# Patient Record
Sex: Female | Born: 1995 | Hispanic: No
Health system: Southern US, Community
[De-identification: ages and names within clinical notes are randomized; demographics above are authoritative.]

## PROBLEM LIST (undated history)

## (undated) DIAGNOSIS — F419 Anxiety disorder, unspecified: Secondary | ICD-10-CM

## (undated) DIAGNOSIS — F32A Depression, unspecified: Secondary | ICD-10-CM

## (undated) DIAGNOSIS — F329 Major depressive disorder, single episode, unspecified: Secondary | ICD-10-CM

## (undated) DIAGNOSIS — J45909 Unspecified asthma, uncomplicated: Secondary | ICD-10-CM

## (undated) DIAGNOSIS — J9601 Acute respiratory failure with hypoxia: Secondary | ICD-10-CM

## (undated) DIAGNOSIS — T7840XA Allergy, unspecified, initial encounter: Secondary | ICD-10-CM

## (undated) HISTORY — DX: Depression, unspecified: F32.A

## (undated) HISTORY — DX: Unspecified asthma, uncomplicated: J45.909

## (undated) HISTORY — PX: DENTAL SURGERY: SHX609

## (undated) HISTORY — DX: Major depressive disorder, single episode, unspecified: F32.9

## (undated) HISTORY — DX: Allergy, unspecified, initial encounter: T78.40XA

## (undated) HISTORY — DX: Acute respiratory failure with hypoxia: J96.01

---
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
## 2016-02-17 ENCOUNTER — Other Ambulatory Visit: Payer: Self-pay | Admitting: Pediatrics

## 2016-02-17 ENCOUNTER — Telehealth: Payer: Self-pay | Admitting: *Deleted

## 2016-02-17 NOTE — Telephone Encounter (Signed)
Vm from pt requesting refill of Lutera. Pt has not been seen in office since 07/17/14. Per Dr. Lamar SprinklesPerry's last note, no more refills are to be given until she is seen in office for a f/u appt.    TC returned to mobile phone number in chart. LVM requesting pt call office to schedule a f/u appt.

## 2016-03-01 IMAGING — CR DG CHEST 1V PORT
1 series · 1 of 1 positions shown · non-contrast
Comparison: 07/09/2014

CLINICAL DATA: Evaluate airspace disease.

EXAM:
PORTABLE CHEST - 1 VIEW

[AP]
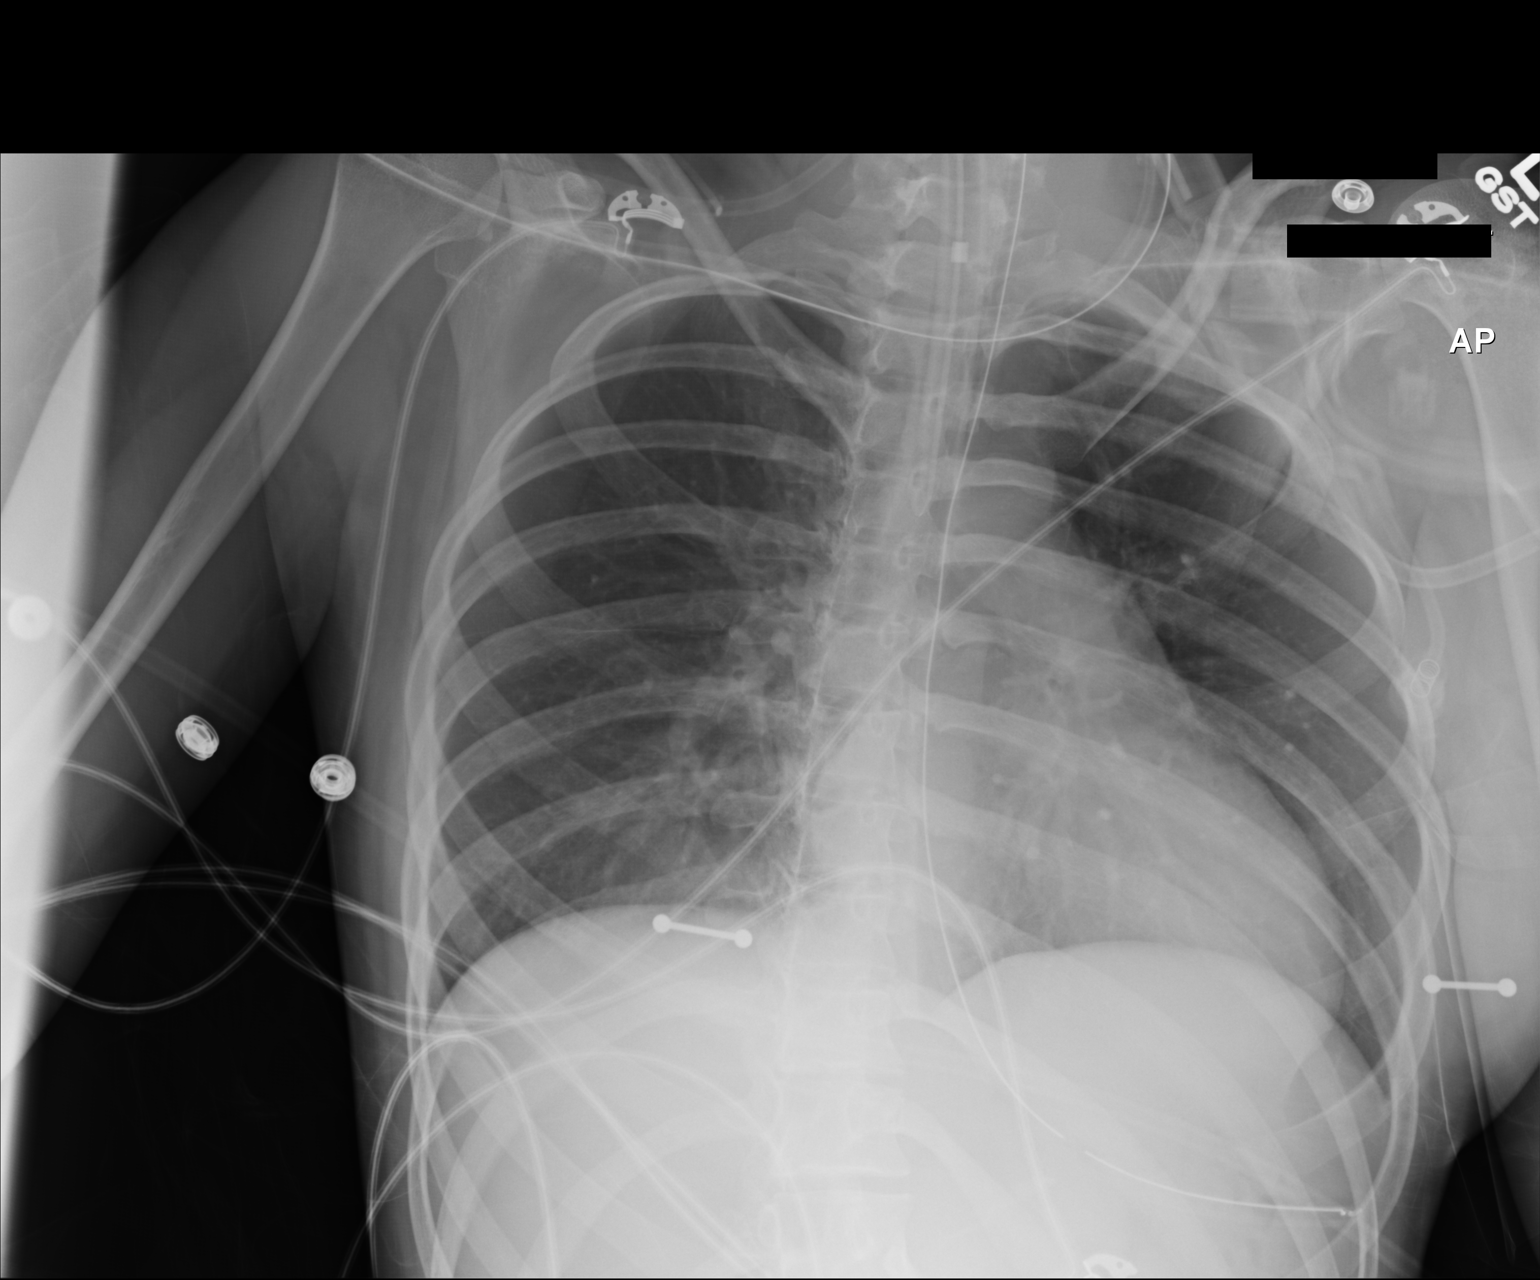

[1 of 1 positions shown; findings below may reference images not displayed]

FINDINGS: Endotracheal tube ends between the clavicular heads and carina. The
orogastric tube ends in the proximal stomach.

No cardiomegaly when considering rotation. Probable mild right
basilar atelectasis based on hazy density. No evidence of
consolidation, edema, effusion, or pneumothorax.
IMPRESSION: 1. Endotracheal and orogastric tubes remain in good position.
2. Probable mild atelectasis at the medial right base.

## 2016-03-02 IMAGING — CR DG CHEST 1V PORT
1 series · 1 of 1 positions shown · non-contrast
Comparison: 07/10/2014

CLINICAL DATA: Followup aspiration

EXAM:
PORTABLE CHEST - 1 VIEW

[AP]
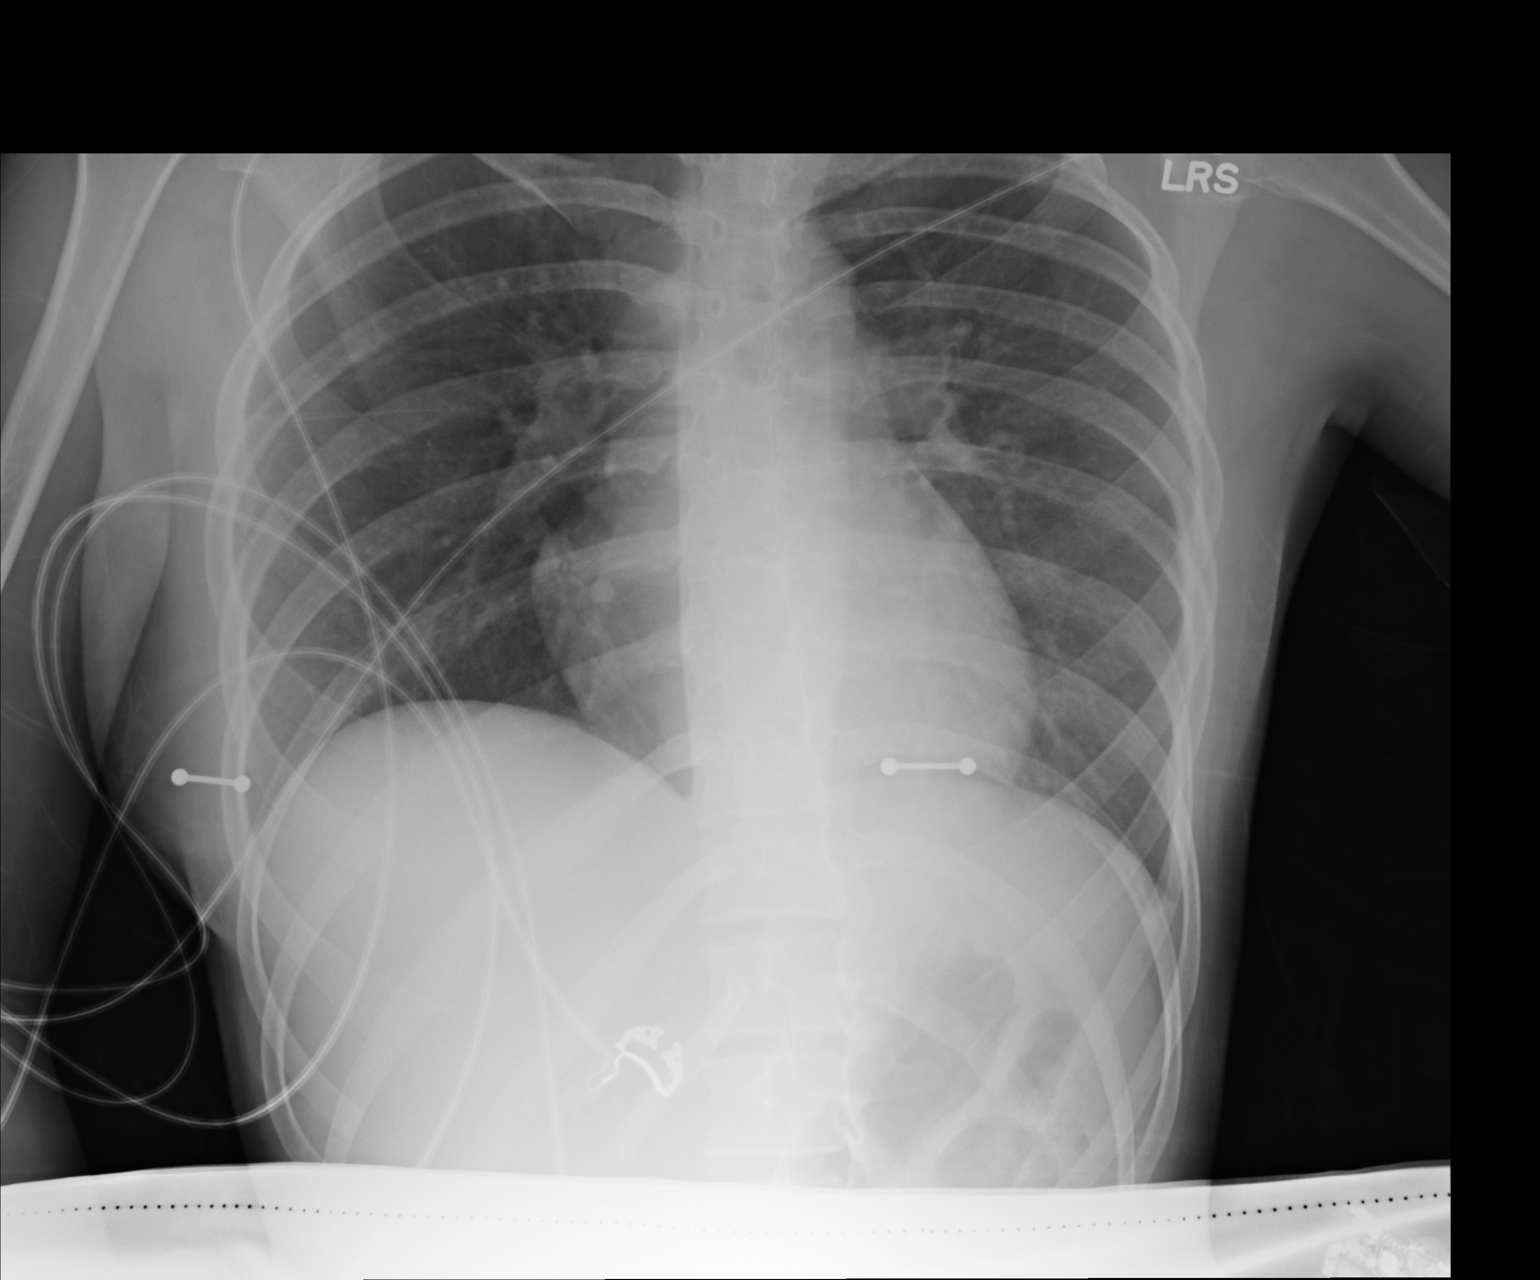

[1 of 1 positions shown; findings below may reference images not displayed]

FINDINGS: Normal heart size and upper mediastinal contours. The right lung is
normally inflated. Apparent volume loss and medial opacification on
the left is likely due to rotation. No effusion or pneumothorax. No
pulmonary edema.
IMPRESSION: Normalized right lung inflation.

## 2016-06-29 ENCOUNTER — Encounter: Payer: Self-pay | Admitting: Pediatrics

## 2016-06-30 ENCOUNTER — Encounter: Payer: Self-pay | Admitting: Pediatrics

## 2018-10-23 ENCOUNTER — Ambulatory Visit: Payer: Self-pay | Admitting: Family Medicine

## 2018-11-14 ENCOUNTER — Ambulatory Visit: Payer: Self-pay | Admitting: Family Medicine

## 2018-12-19 ENCOUNTER — Encounter: Payer: Self-pay | Admitting: Internal Medicine

## 2018-12-19 ENCOUNTER — Encounter

## 2018-12-19 ENCOUNTER — Ambulatory Visit (INDEPENDENT_AMBULATORY_CARE_PROVIDER_SITE_OTHER): Payer: BC Managed Care – PPO | Admitting: Internal Medicine

## 2018-12-19 VITALS — BP 110/80 | HR 102 | Temp 98.4°F | Ht 62.25 in | Wt 102.9 lb

## 2018-12-19 DIAGNOSIS — F39 Unspecified mood [affective] disorder: Secondary | ICD-10-CM | POA: Diagnosis not present

## 2018-12-19 DIAGNOSIS — F901 Attention-deficit hyperactivity disorder, predominantly hyperactive type: Secondary | ICD-10-CM

## 2018-12-19 DIAGNOSIS — G479 Sleep disorder, unspecified: Secondary | ICD-10-CM

## 2018-12-19 DIAGNOSIS — Z3002 Counseling and instruction in natural family planning to avoid pregnancy: Secondary | ICD-10-CM

## 2018-12-19 MED ORDER — LEVONORGESTREL-ETHINYL ESTRAD 0.1-20 MG-MCG PO TABS: 1.0000 | ORAL_TABLET | Freq: Every day | ORAL | 0 refills | Status: DC

## 2018-12-19 NOTE — Progress Notes (Signed)
New Patient Office Visit     CC/Reason for Visit: Establish care, discuss ADD, mood issues Previous PCP: Unknown   HPI: Jamie Humphrey is a 23 y.o. female who is coming in today for the above mentioned reasons.  She is due for an annual physical exam.  Had a Pap smear within the last 3 years of college.  She just finished college in May.  Her mental health issues are complex.  Her father committed suicide when patient was 23 years old.  She notes significant issues with anxiety mainly but depression at times as well.  She comes in today with her mother.  Mom states that she was diagnosed with ADD as a child was prescribed Strattera at one point she did not do well while on this and felt depressed all the time.  Throughout the year she has also been on Lexapro and other antidepressants but she did not do well on any.  She is visibly anxious, tearful today constantly moving her legs or a pencil in her hands.  She states she does this not because she is anxious but as an outlet for her hyperactivity.  She states that while at school she found ways to overcome her hyperactivity but she recognizes that she has always had issues with this.  She is active with a counselor by the name of [facility], has never seen a psychiatrist.  Has not been on medications recently.  She states she is very emotional today because her boyfriend of 18 months broke up with her yesterday.  She needs refills of her birth control medication, she would like to do her annual physicals including  Pap smears through this office.   Past Medical/Surgical History: Past Medical History:  Diagnosis Date  . Acute respiratory failure with hypoxemia (HCC) 07/10/2014  . Allergy   . Anxiety   . Asthma    exercise induced  . Depression     Past Surgical History:  Procedure Laterality Date  . DENTAL SURGERY     pulpectomy    Social History:  reports that she has been smoking cigarettes. She has never used smokeless  tobacco. She reports current alcohol use. She reports current drug use. Drug: Marijuana.  Allergies: Allergies  Allergen Reactions  . Ativan [Lorazepam] Other (See Comments)    Family history    Family History:  Family History  Problem Relation Age of Onset  . Suicidality Father      Current Outpatient Medications:  .  levonorgestrel-ethinyl estradiol (LUTERA) 0.1-20 MG-MCG tablet, Take 1 tablet by mouth daily., Disp: 1 Package, Rfl: 0  Review of Systems: Constitutional: Denies fever, chills, diaphoresis, appetite change and fatigue.  HEENT: Denies photophobia, eye pain, redness, hearing loss, ear pain, congestion, sore throat, rhinorrhea, sneezing, mouth sores, trouble swallowing, neck pain, neck stiffness and tinnitus.   Respiratory: Denies SOB, DOE, cough, chest tightness,  and wheezing.   Cardiovascular: Denies chest pain, palpitations and leg swelling.  Gastrointestinal: Denies nausea, vomiting, abdominal pain, diarrhea, constipation, blood in stool and abdominal distention.  Genitourinary: Denies dysuria, urgency, frequency, hematuria, flank pain and difficulty urinating.  Endocrine: Denies: hot or cold intolerance, sweats, changes in hair or nails, polyuria, polydipsia. Musculoskeletal: Denies myalgias, back pain, joint swelling, arthralgias and gait problem.  Skin: Denies pallor, rash and wound.  Neurological: Denies dizziness, seizures, syncope, weakness, light-headedness, numbness and headaches.  Hematological: Denies adenopathy. Easy bruising, personal or family bleeding history  Psychiatric/Behavioral: Denies suicidal ideation, confusion, positive for mood changes,  nervousness, sleep disturbance and agitation    Physical Exam: Vitals:   12/19/18 0736  BP: 110/80  Pulse: (!) 102  Temp: 98.4 F (36.9 C)  TempSrc: Oral  SpO2: 98%  Weight: 102 lb 14.4 oz (46.7 kg)  Height: 5' 2.25" (1.581 m)   Body mass index is 18.67 kg/m.  Constitutional: NAD, calm,  comfortable Eyes: PERRL, lids and conjunctivae normal ENMT: Mucous membranes are moist.  Neck: normal, supple, no masses, no thyromegaly Respiratory: clear to auscultation bilaterally, no wheezing, no crackles. Normal respiratory effort. No accessory muscle use.  Cardiovascular: Regular rate and rhythm, no murmurs / rubs / gallops. No extremity edema. 2+ pedal pulses. No carotid bruits.  Abdomen: no tenderness, no masses palpated. No hepatosplenomegaly. Bowel sounds positive.  Musculoskeletal: no clubbing / cyanosis. No joint deformity upper and lower extremities. Good ROM, no contractures. Normal muscle tone.  Skin: no rashes, lesions, ulcers. No induration Neurologic: CN 2-12 grossly intact. Sensation intact, DTR normal. Strength 5/5 in all 4.  Psychiatric: Normal judgment and insight. Alert and oriented x 3.  She is visibly anxious, unable to focus, restless.   Impression and Plan:  Mood disorder (HCC) - Plan: Ambulatory referral to Psychiatry Sleep disturbance -She seems both anxious and depressed, I wonder if she may have bipolar disorder in addition to her ADD. -Her mood/mental health issues appear complex and I believe she would be better served by seeing a psychiatrist prior to restarting any medication therapy. -She already has CBT with her longtime counselor.  I will attempt to achieve records today.  Attention deficit hyperactivity disorder (ADHD), predominantly hyperactive type -I agree with this diagnosis, given her underlying mood disorder I do not feel comfortable being the sole provider treating this. -Will send urgent referral to psychiatry, have also given her information about the [place] [facility].  Encounter for counseling and instruction in natural family planning to avoid pregnancy - Plan: levonorgestrel-ethinyl estradiol (LUTERA) 0.1-20 MG-MCG tablet -She will return within the next 3 to 4 months for her annual physical  to include Pap  smears.     Patient Instructions  -It was nice meeting you today!  -Will send out an urgent referral to psychiatry.  -Schedule follow up in 3-4 months for your annual physical.     Chaya JanEstela Hernandez Acosta, MD [facility]

## 2018-12-19 NOTE — Patient Instructions (Signed)
-  It was nice meeting you today!  -Will send out an urgent referral to psychiatry.  -Schedule follow up in 3-4 months for your annual physical.

## 2018-12-20 ENCOUNTER — Encounter (HOSPITAL_COMMUNITY): Payer: Self-pay | Admitting: Psychiatry

## 2018-12-20 ENCOUNTER — Ambulatory Visit (INDEPENDENT_AMBULATORY_CARE_PROVIDER_SITE_OTHER): Payer: BC Managed Care – PPO | Admitting: Psychiatry

## 2018-12-20 VITALS — BP 112/68 | Ht 62.0 in | Wt 104.0 lb

## 2018-12-20 DIAGNOSIS — F902 Attention-deficit hyperactivity disorder, combined type: Secondary | ICD-10-CM | POA: Diagnosis not present

## 2018-12-20 MED ORDER — LISDEXAMFETAMINE DIMESYLATE 30 MG PO CAPS: 30.0000 mg | ORAL_CAPSULE | Freq: Every day | ORAL | 0 refills | Status: DC

## 2018-12-20 NOTE — Progress Notes (Addendum)
Psychiatric Initial Adult Assessment   Patient Identification: Jamie Humphrey MRN:  [id number]  Date of Evaluation:  12/20/2018   Referral Source: Self-referred.  Chief Complaint:  I cannot focus.  I have difficulty doing multitasking.  Visit Diagnosis:    ICD-10-CM   1. Attention deficit hyperactivity disorder (ADHD), combined type F90.2 DISCONTINUED: lisdexamfetamine (VYVANSE) 30 MG capsule    History of Present Illness: Jamie Humphrey is 23 year old [place] American single unemployed female who came for her initial appointment with her mother.  Patient is struggle with lack of focus, lack of attention, difficulty multitasking, difficulty organizing her thoughts and falling behind things.  She struggle all her life but able to manage in the school.  She recently finished her college and now applying for graduate school and she is wondering if she can take something to help the symptoms.  She recall in the school she was very hyper and could not wait for her turn to talk.  However she noticed behind her schedule, work, task is always a challenge.  She told that to avoid doing multitasking and concentration she joined multiple groups and she claims herself a very social person and that helped her.  She also involved in arts and theater.  She mention her friend noticed getting easily excited, hyperverbal and cannot wait for her turn.  She admitted feeling discouraged, irritated and frustrated and feels stupid due to the symptoms.  She denies any depression, crying spells, feeling of hopelessness or worthlessness.  She denies any anger, aggressive behavior, self abusive thinking.  She denies any mania, psychosis or any hallucination.  She denies any paranoia or any aggressive behavior.  She tried Strattera in the high school for few months but that did not help her.  She also tried Lexapro when she had a break-up with her boyfriend.  She described it was a situational depression and she got better and get over  with it.  She lives with her mother and stepfather.  Her father is from [place] and her mother never married with him.  Patient told her father committed suicide because he was forced to do arrange marriage and could not cope with the marriage and finances.  She had a good social network.  She see once a week [facility] which works very good for her.  Patient endorses once in a while drink beer but no other use of illegal substance.  Associated Signs/Symptoms: Depression Symptoms:  difficulty concentrating, anxiety, (Hypo) Manic Symptoms:  Distractibility, Anxiety Symptoms:  Excessive Worry, Psychotic Symptoms:  No psychotic symptoms. PTSD Symptoms: Negative  Past Psychiatric History: History of taking Strattera to help with ADHD symptoms.  Did not see any improvement.  Took Lexapro for few months when she had situational depression after break-up with a boyfriend.  No history of psychiatric inpatient treatment or any suicidal attempt.  Previous Psychotropic Medications: Yes   Substance Abuse History in the last 12 months:  No.  Consequences of Substance Abuse: Negative  Past Medical History:  Past Medical History:  Diagnosis Date  . Acute respiratory failure with hypoxemia (HCC) 07/10/2014  . Allergy   . Anxiety   . Asthma    exercise induced  . Depression     Past Surgical History:  Procedure Laterality Date  . DENTAL SURGERY     pulpectomy    Family Psychiatric History: Father committed suicide.  Family History:  Family History  Problem Relation Age of Onset  . Suicidality Father     Social History:  Social History   Socioeconomic History  . Marital status: Single    Spouse name: Not on file  . Number of children: 0  . Years of education: Not on file  . Highest education level: Not on file  Occupational History  . Not on file  Social Needs  . Financial resource strain: Not very hard  . Food insecurity:    Worry: Never true    Inability:  Never true  . Transportation needs:    Medical: No    Non-medical: No  Tobacco Use  . Smoking status: Current Some Day Smoker    Packs/day: 0.50    Types: Cigarettes  . Smokeless tobacco: Never Used  Substance and Sexual Activity  . Alcohol use: Yes    Comment: occasionally  . Drug use: Not Currently    Types: Marijuana  . Sexual activity: Yes    Birth control/protection: Pill    Comment: unknown  Lifestyle  . Physical activity:    Days per week: 3 days    Minutes per session: 30 min  . Stress: Not on file  Relationships  . Social connections:    Talks on phone: Not on file    Gets together: Not on file    Attends religious service: Not on file    Active member of club or organization: Not on file    Attends meetings of clubs or organizations: Not on file    Relationship status: Not on file  Other Topics Concern  . Not on file  Social History Narrative  . Not on file    Additional Social History: Patient born and raised in [place].  Her father and mother never married.  Father was from [place] who committed suicide when patient was only 23 years old.  Patient told he was under a lot of pressure because his father parents forced him to married from a [place] woman and his father could not cope with the marriage.  Patient has a strep brother.  Patient lives with her mother and stepfather.  She get along very well with the family members.  She is applying to graduate school.  Allergies:   Allergies  Allergen Reactions  . Ativan [Lorazepam] Other (See Comments)    Family history    Metabolic Disorder Labs: No results found for: HGBA1C, MPG No results found for: PROLACTIN No results found for: CHOL, TRIG, HDL, CHOLHDL, VLDL, LDLCALC No results found for: TSH  Therapeutic Level Labs: No results found for: LITHIUM No results found for: CBMZ No results found for: VALPROATE  Current Medications: Current Outpatient Medications  Medication Sig Dispense  Refill  . levonorgestrel-ethinyl estradiol (LUTERA) 0.1-20 MG-MCG tablet Take 1 tablet by mouth daily. 1 Package 0   No current facility-administered medications for this visit.     Musculoskeletal: Strength & Muscle Tone: within normal limits Gait & Station: normal Patient leans: N/A  Psychiatric Specialty Exam: Review of Systems  Constitutional: Negative.   HENT: Negative.   Respiratory: Negative.   Musculoskeletal: Negative.   Skin: Negative.   Neurological: Negative.     Blood pressure 112/68, height 5\' 2"  (1.575 m), weight 104 lb (47.2 kg), last menstrual period 12/03/2018.Body mass index is 19.02 kg/m.  General Appearance: Casual  Eye Contact:  Good  Speech:  Clear and Coherent and Fast  Volume:  Normal  Mood:  Pleasant  Affect:  Congruent  Thought Process:  Goal Directed  Orientation:  Full (Time, Place, and Person)  Thought Content:  WDL  Suicidal Thoughts:  No  Homicidal Thoughts:  No  Memory:  Immediate;   Good Recent;   Good Remote;   Good  Judgement:  Good  Insight:  Good  Psychomotor Activity:  Increased  Concentration:  Concentration: Poor and Attention Span: Poor  Recall:  Fair  Fund of Knowledge:Good  Language: Good  Akathisia:  No  Handed:  Right  AIMS (if indicated):  not done  Assets:  Communication Skills Desire for Improvement Resilience Social Support Talents/Skills  ADL's:  Intact  Cognition: WNL  Sleep:  Good   Screenings: PHQ2-9     Office Visit from 12/19/2018 in [facility] at [facility] Total Score  0  PHQ-9 Total Score  6      Assessment and Plan: Patient is 23 year old female who had a symptoms of ADHD.  She had tried Strattera in high school but did not see any improvement.  She has no active health issues.  We will start Vyvanse 30 mg daily to help her ADHD symptoms however I discussed that stimulant can cause anxiety, insomnia, weight loss and tremors.  Patient is willing to give a trial.  We discussed the  emergent abuse, dependency, tolerance and withdrawal.  Discussed safety concern that any time having active suicidal thoughts or homicidal thoughts or worsening of the symptoms then she should call us immediately or go to local emergency room.  Encouraged to continue therapy for coping skills with [facility].  I will see her again in 4 to 6 weeks.   Cleotis Nipper, MD 1/16/202011:54 AM

## 2019-01-02 ENCOUNTER — Ambulatory Visit: Payer: Self-pay | Admitting: Family Medicine

## 2019-01-09 ENCOUNTER — Other Ambulatory Visit: Payer: Self-pay | Admitting: Internal Medicine

## 2019-01-09 DIAGNOSIS — Z3002 Counseling and instruction in natural family planning to avoid pregnancy: Secondary | ICD-10-CM

## 2019-01-16 ENCOUNTER — Ambulatory Visit (INDEPENDENT_AMBULATORY_CARE_PROVIDER_SITE_OTHER): Payer: BC Managed Care – PPO | Admitting: Psychiatry

## 2019-01-16 ENCOUNTER — Encounter (HOSPITAL_COMMUNITY): Payer: Self-pay | Admitting: Psychiatry

## 2019-01-16 DIAGNOSIS — F902 Attention-deficit hyperactivity disorder, combined type: Secondary | ICD-10-CM | POA: Diagnosis not present

## 2019-01-16 MED ORDER — LISDEXAMFETAMINE DIMESYLATE 30 MG PO CAPS: 30.0000 mg | ORAL_CAPSULE | Freq: Every day | ORAL | 0 refills | Status: DC

## 2019-01-16 NOTE — Progress Notes (Signed)
BH MD/PA/NP OP Progress Note  01/16/2019 9:31 AM Jamie Humphrey  MRN:  [id number]  Chief Complaint: I am taking medication.  In the beginning I have anxiety but now I am tolerating very well.  It seems to be working.  HPI: Jamie Humphrey came for her appointment.  She is a 23 year old [place] American single unemployed female who was seen first time on January 16.  Today she came with her mother who is very supportive.  We started her on Vyvanse 30 mg to help her ADHD symptoms.  Patient was experiencing with lack of focus, attention, difficulty multitasking and difficulty organizing her thoughts.  In the beginning she noticed some anxiety and nervousness but now she is tolerating the dose very well.  She feel it is helping her attention and motivation to do things.  Her mother also endorsed much improvement in her irritability and hyperactivity.  She still struggle with lack of appetite as she lost 3 pounds since she started the medication.  But now she realized about her lack of appetite and she is making sure that she should eat on a regular basis.  She has no tremors, palpitation, shakes.  She has no paranoia or any hallucination.  She denies any suicidal thoughts.  Her energy level is good.  She sleeps at least 8 to 9 hours every night.  She is planning to [place].  Patient denies drinking or using any illegal substances.  She lives with her mother.   Visit Diagnosis:    ICD-10-CM   1. Attention deficit hyperactivity disorder (ADHD), combined type F90.2 lisdexamfetamine (VYVANSE) 30 MG capsule    Past Psychiatric History: Reviewed. H/O ADHD Tried Strattera without any improvement.  Tried Lexapro for few months after break-up with the boyfriend.  No history of inpatient or any suicidal attempt.  Past Medical History:  Past Medical History:  Diagnosis Date  . Acute respiratory failure with hypoxemia (HCC) 07/10/2014  . Allergy   . Anxiety   . Asthma    exercise induced  .  Depression     Past Surgical History:  Procedure Laterality Date  . DENTAL SURGERY     pulpectomy    Family Psychiatric History: Reviewed.  Family History:  Family History  Problem Relation Age of Onset  . Suicidality Father     Social History:  Social History   Socioeconomic History  . Marital status: Single    Spouse name: Not on file  . Number of children: 0  . Years of education: Not on file  . Highest education level: Not on file  Occupational History  . Not on file  Social Needs  . Financial resource strain: Not very hard  . Food insecurity:    Worry: Never true    Inability: Never true  . Transportation needs:    Medical: No    Non-medical: No  Tobacco Use  . Smoking status: Current Some Day Smoker    Packs/day: 0.25    Types: Cigarettes  . Smokeless tobacco: Never Used  Substance and Sexual Activity  . Alcohol use: Yes    Comment: occasionally  . Drug use: Not Currently    Types: Marijuana  . Sexual activity: Yes    Birth control/protection: Pill    Comment: unknown  Lifestyle  . Physical activity:    Days per week: 3 days    Minutes per session: 30 min  . Stress: Not on file  Relationships  . Social connections:    Talks on  phone: Not on file    Gets together: Not on file    Attends religious service: Not on file    Active member of club or organization: Not on file    Attends meetings of clubs or organizations: Not on file    Relationship status: Not on file  Other Topics Concern  . Not on file  Social History Narrative  . Not on file    Allergies:  Allergies  Allergen Reactions  . Ativan [Lorazepam] Other (See Comments)    Family history    Metabolic Disorder Labs: No results found for: HGBA1C, MPG No results found for: PROLACTIN No results found for: CHOL, TRIG, HDL, CHOLHDL, VLDL, LDLCALC No results found for: TSH  Therapeutic Level Labs: No results found for: LITHIUM No results found for: VALPROATE No components found  for:  CBMZ  Current Medications: Current Outpatient Medications  Medication Sig Dispense Refill  . lisdexamfetamine (VYVANSE) 30 MG capsule Take 1 capsule (30 mg total) by mouth daily. 30 capsule 0  . SRONYX 0.1-20 MG-MCG tablet TAKE 1 TABLET BY MOUTH EVERY DAY 28 tablet 11   No current facility-administered medications for this visit.      Musculoskeletal: Strength & Muscle Tone: within normal limits Gait & Station: normal Patient leans: N/A  Psychiatric Specialty Exam: ROS  Blood pressure 110/68, pulse 80, height 5\' 2"  (1.575 m), weight 101 lb (45.8 kg).Body mass index is 18.47 kg/m.  General Appearance: Casual  Eye Contact:  Good  Speech:  Clear and Coherent  Volume:  Normal  Mood:  Good  Affect:  Congruent  Thought Process:  Goal Directed  Orientation:  Full (Time, Place, and Person)  Thought Content: Logical   Suicidal Thoughts:  No  Homicidal Thoughts:  No  Memory:  Immediate;   Good Recent;   Good Remote;   Good  Judgement:  Good  Insight:  Good  Psychomotor Activity:  Normal  Concentration:  Concentration: Fair and Attention Span: Fair  Recall:  Good  Fund of Knowledge: Good  Language: Good  Akathisia:  No  Handed:  Right  AIMS (if indicated): not done  Assets:  Communication Skills Desire for Improvement Housing Social Support  ADL's:  Intact  Cognition: WNL  Sleep:  Fair   Screenings: PHQ2-9     Office Visit from 12/19/2018 in [facility] at [facility] Total Score  0  PHQ-9 Total Score  6       Assessment and Plan: Attention deficit hyperactivity disorder, combined type.  Patient doing better on Vyvanse 30 mg.  In the beginning she has difficulty tolerating but now she is feeling better.  We discussed concern about weight loss but she is now more aware about eating regularly.  She feel it is helping her attention, focus and multitasking.  She has no tremors or shakes.  Recommended to continue 30 mg Vyvanse every day.  Discussed  stimulant abuse tolerance withdrawal and dependency.  Encouraged to continue therapy with [facility] once a week.  Recommended to call us back if she has any question or any concern.  Discussed medication side effects and benefits.  Follow-up in 2 months.   Cleotis Nipper, MD 01/16/2019, 9:31 AM

## 2019-01-24 ENCOUNTER — Ambulatory Visit (HOSPITAL_COMMUNITY): Payer: Self-pay | Admitting: Psychiatry

## 2019-02-15 ENCOUNTER — Encounter: Payer: Self-pay | Admitting: Internal Medicine

## 2019-02-15 ENCOUNTER — Other Ambulatory Visit: Payer: Self-pay

## 2019-02-15 ENCOUNTER — Ambulatory Visit: Payer: BC Managed Care – PPO | Admitting: Internal Medicine

## 2019-02-15 VITALS — BP 100/64 | HR 101 | Temp 99.2°F | Wt 98.2 lb

## 2019-02-15 DIAGNOSIS — B9789 Other viral agents as the cause of diseases classified elsewhere: Secondary | ICD-10-CM | POA: Diagnosis not present

## 2019-02-15 DIAGNOSIS — J069 Acute upper respiratory infection, unspecified: Secondary | ICD-10-CM

## 2019-02-15 NOTE — Patient Instructions (Signed)
-  I hope you feel better soon!  -May use mucinex twice a day, delsym twice a day as needed for cough and over the counter flu/sinues medication as needed for pain and congestion.  -Come back to see [place] if no improvement in 10-14 days.

## 2019-02-15 NOTE — Progress Notes (Signed)
Established Patient Office Visit     CC/Reason for Visit: Cough, right-sided chest wall pain  HPI: Jamie Humphrey is a 23 y.o. female who is coming in today for the above mentioned reasons. Past Medical History is significant for: ADHD who was recently started on Vyvanse by psychiatry and is doing remarkably well.  She comes in today with a dry hacking cough that began about 1 week ago.  She has subsequently developed pain with deep inspiration under her right breast.  She denies fevers, chills, no nausea and vomiting although she does state that the coughing has been so severe that she sometimes thought that she might have to vomit.  She denies body aches, she has been fatigued.  She denies sick contacts or recent travel.   Past Medical/Surgical History: Past Medical History:  Diagnosis Date  . Acute respiratory failure with hypoxemia (HCC) 07/10/2014  . Allergy   . Anxiety   . Asthma    exercise induced  . Depression     Past Surgical History:  Procedure Laterality Date  . DENTAL SURGERY     pulpectomy    Social History:  reports that she has been smoking cigarettes. She has been smoking about 0.25 packs per day. She has never used smokeless tobacco. She reports current alcohol use. She reports previous drug use. Drug: Marijuana.  Allergies: Allergies  Allergen Reactions  . Ativan [Lorazepam] Other (See Comments)    Family history    Family History:  Family History  Problem Relation Age of Onset  . Suicidality Father      Current Outpatient Medications:  .  lisdexamfetamine (VYVANSE) 30 MG capsule, Take 1 capsule (30 mg total) by mouth daily., Disp: 30 capsule, Rfl: 0 .  SRONYX 0.1-20 MG-MCG tablet, TAKE 1 TABLET BY MOUTH EVERY DAY, Disp: 28 tablet, Rfl: 11  Review of Systems:  Constitutional: Denies fever, chills, diaphoresis, appetite change. HEENT: Denies photophobia, eye pain, redness, hearing loss, ear pain, congestion, sore throat, rhinorrhea,  sneezing, mouth sores, trouble swallowing, neck pain, neck stiffness and tinnitus.   Respiratory: Denies SOB, DOE, hest tightness,  and wheezing.   Cardiovascular: Denies chest pain, palpitations and leg swelling.  Gastrointestinal: Denies nausea, vomiting, abdominal pain, diarrhea, constipation, blood in stool and abdominal distention.  Genitourinary: Denies dysuria, urgency, frequency, hematuria, flank pain and difficulty urinating.  Endocrine: Denies: hot or cold intolerance, sweats, changes in hair or nails, polyuria, polydipsia. Musculoskeletal: Denies myalgias, back pain, joint swelling, arthralgias and gait problem.  Skin: Denies pallor, rash and wound.  Neurological: Denies dizziness, seizures, syncope, weakness, light-headedness, numbness and headaches.  Hematological: Denies adenopathy. Easy bruising, personal or family bleeding history  Psychiatric/Behavioral: Denies suicidal ideation, mood changes, confusion, nervousness, sleep disturbance and agitation    Physical Exam: Vitals:   02/15/19 1615  BP: 100/64  Pulse: (!) 101  Temp: 99.2 F (37.3 C)  TempSrc: Oral  SpO2: 98%  Weight: 98 lb 3.2 oz (44.5 kg)    Body mass index is 17.96 kg/m.   Constitutional: NAD, calm, comfortable Eyes: PERRL, lids and conjunctivae normal ENMT: Mucous membranes are moist. Posterior pharynx is erythematous but clear of any exudate or lesions. Normal dentition. Tympanic membrane is pearly white, no erythema or bulging. Neck: normal, supple, no masses, no thyromegaly Respiratory: clear to auscultation bilaterally, no wheezing, no crackles. Normal respiratory effort. No accessory muscle use.  Cardiovascular: Regular rate and rhythm, no murmurs / rubs / gallops. No extremity edema. 2+ pedal pulses. No carotid  bruits.   Impression and Plan:  Viral URI with cough -Suspect pain with inspiration is chest wall pain due to excessive coughing.  Lung auscultation is normal. -Given exam findings,  PNA, pharyngitis, ear infection are not likely, hence abx have not been prescribed. -Have advised rest, fluids, OTC antihistamines, cough suppressants and mucinex. -RTC if no improvement in 10-14 days.     Patient Instructions  -I hope you feel better soon!  -May use mucinex twice a day, delsym twice a day as needed for cough and over the counter flu/sinues medication as needed for pain and congestion.  -Come back to see [place] if no improvement in 10-14 days.       Chaya Jan, MD [facility]

## 2019-02-19 ENCOUNTER — Telehealth (HOSPITAL_COMMUNITY): Payer: Self-pay

## 2019-02-19 DIAGNOSIS — F902 Attention-deficit hyperactivity disorder, combined type: Secondary | ICD-10-CM

## 2019-02-19 MED ORDER — LISDEXAMFETAMINE DIMESYLATE 30 MG PO CAPS: 30.0000 mg | ORAL_CAPSULE | Freq: Every day | ORAL | 0 refills | Status: DC

## 2019-02-19 NOTE — Telephone Encounter (Signed)
Prescription sent to the pharmacy.

## 2019-02-19 NOTE — Telephone Encounter (Signed)
Patient is calling for a refill on her Vyvanse, she uses [facility] on [address]

## 2019-02-21 ENCOUNTER — Ambulatory Visit: Payer: Self-pay | Admitting: *Deleted

## 2019-02-21 NOTE — Telephone Encounter (Signed)
Pt calling tearful with complaints of difficulty breathing and right sided chest pain for a week and a half. Pt was seen in office on 02/15/19 for same symptoms and states that symptoms have become worse in the past day or so. Pt states she has a sharp pain with breathing in and out on the right side. Pt states she has not checked her temperature recently but th last time it was checked was 98.9. Pt states states that pain increases with inhalation and states it hurts more with sitting up, moving and doing any activity. Pt advised with current symptoms she should be seen today and that she could be scheduled for a OV but she is unable to come in she should go to the ED. Pt states her mom does not get off work until 4:30 pm and she does not drive. Pt states once talks with her mother she will return call to office to schedule appt. Pt advised to seek treatment in the ED if symptoms become worse. Pt verbalized understanding.  Reason for Disposition . [1] MILD difficulty breathing (e.g., minimal/no SOB at rest, SOB with walking, pulse <100) AND [2] NEW-onset or WORSE than normal  Answer Assessment - Initial Assessment Questions 1. RESPIRATORY STATUS: "Describe your breathing?" (e.g., wheezing, shortness of breath, unable to speak, severe coughing)      Short of breath, pain on right side of chest in lung 2. ONSET: "When did this breathing problem begin?"      Started about a week and a half ago but became worse within the past day 3. PATTERN "Does the difficult breathing come and go, or has it been constant since it started?"      Worse with activity such as moving and sitting up 4. SEVERITY: "How bad is your breathing?" (e.g., mild, moderate, severe)    - MILD: No SOB at rest, mild SOB with walking, speaks normally in sentences, can lay down, no retractions, pulse < 100.    - MODERATE: SOB at rest, SOB with minimal exertion and prefers to sit, cannot lie down flat, speaks in phrases, mild retractions,  audible wheezing, pulse 100-120.    - SEVERE: Very SOB at rest, speaks in single words, struggling to breathe, sitting hunched forward, retractions, pulse > 120      moderate 5. RECURRENT SYMPTOM: "Have you had difficulty breathing before?" If so, ask: "When was the last time?" and "What happened that time?"      Yes for the last week and a half     6. LUNG HISTORY: "Do you have any history of lung disease?"  (e.g., pulmonary embolus, asthma, emphysema)     No, but states she does smoke 7. CAUSE: "What do you think is causing the breathing problem?"      unknown 8. OTHER SYMPTOMS: "Do you have any other symptoms? (e.g., dizziness, runny nose, cough, chest pain, fever)     Dry cough, chest pain on the right side 9. TRAVEL: "Have you traveled out of the country in the last month?" (e.g., travel history, exposures)       No  Protocols used: BREATHING DIFFICULTY-A-AH

## 2019-03-15 ENCOUNTER — Ambulatory Visit (INDEPENDENT_AMBULATORY_CARE_PROVIDER_SITE_OTHER): Payer: BC Managed Care – PPO | Admitting: Psychiatry

## 2019-03-15 ENCOUNTER — Other Ambulatory Visit: Payer: Self-pay

## 2019-03-15 DIAGNOSIS — F902 Attention-deficit hyperactivity disorder, combined type: Secondary | ICD-10-CM | POA: Diagnosis not present

## 2019-03-15 MED ORDER — LISDEXAMFETAMINE DIMESYLATE 30 MG PO CAPS: 30.0000 mg | ORAL_CAPSULE | Freq: Every day | ORAL | 0 refills | Status: DC

## 2019-03-15 NOTE — Progress Notes (Signed)
Virtual Visit via Telephone Note  I connected with Jamie Humphrey on 03/15/19 at  8:40 AM EDT by telephone and verified that I am speaking with the correct person using two identifiers.   I discussed the limitations, risks, security and privacy concerns of performing an evaluation and management service by telephone and the availability of in person appointments. I also discussed with the patient that there may be a patient responsible charge related to this service. The patient expressed understanding and agreed to proceed.   History of Present Illness: Patient was evaluated through phone session.  She is very excited because she got accepted at the graduation of school in [place] and hoping to start in September.  She is taking Vyvanse which is helping her multitasking, focus and attention.  She started gardening and pick up the new hobbies while she is staying home due to pandemic coronavirus.  Lately she had flulike symptoms and she is not sure if she had coronavirus but now she is feeling much better.  She admitted dropping few pounds but back to normal.  Today she checked her weight and it was 101 pound.  She endorsed that her appetite is also back.  She told that those few days when she was sick she has not taken her Vyvanse.  She is sleeping good.  She denies any mania, psychosis, paranoia or any presenation.  She denies any suicidal thoughts.  She is not drinking or using any illegal substances.  She lives with her mother who is very supportive.  Past Psychiatric History: Reviewed. H/O ADHD Tried Strattera without any improvement.  Tried Lexapro for few months after break-up with the boyfriend.  No history of inpatient or any suicidal attempt.   Observations/Objective: Limited mental status examination done on the phone.  Patient describes her mood is good.  Her speech is somewhat fast but clear and coherent.  Her thought process logical.  She denies any auditory or visual  hallucination.  She denies any active or passive suicidal thoughts or homicidal thought.  She is alert and oriented x3.  Her cognition is intact.  She do not reported any tremors, palpitation or shakes.  Her fund of knowledge is good.  Her insight judgment is okay.  Assessment and Plan: Attention deficit hyperactivity disorder, combined type.  Patient is a stable on her current dose of Vyvanse 30 mg.  She reported no side effects.  Encouraged her new hobby which is gardening.  She has taken time off from therapy for now.  Discussed medication side effects and benefits specially stimulant abuse dependency and withdrawal.  Recommended to call us back if she is any question or any concern.  Follow-up in 3 months.  Follow Up Instructions:    I discussed the assessment and treatment plan with the patient. The patient was provided an opportunity to ask questions and all were answered. The patient agreed with the plan and demonstrated an understanding of the instructions.   The patient was advised to call back or seek an in-person evaluation if the symptoms worsen or if the condition fails to improve as anticipated.  I provided 20 minutes of non-face-to-face time during this encounter.   Cleotis Nipper, MD

## 2019-04-26 ENCOUNTER — Telehealth (HOSPITAL_COMMUNITY): Payer: Self-pay

## 2019-04-26 DIAGNOSIS — F902 Attention-deficit hyperactivity disorder, combined type: Secondary | ICD-10-CM

## 2019-04-26 NOTE — Telephone Encounter (Signed)
Patient is calling for a refill on her Vyvanse. She also wants you to know that she has been accepted into an exchange program to study in [place] and she will need a letter stating that she will be okay to study abroad. Please review and advise, thank you

## 2019-04-30 MED ORDER — LISDEXAMFETAMINE DIMESYLATE 30 MG PO CAPS: 30.0000 mg | ORAL_CAPSULE | Freq: Every day | ORAL | 0 refills | Status: DC

## 2019-04-30 NOTE — Telephone Encounter (Signed)
Prescription sent to her pharmacy. Yes, she can have letter to study abroad.

## 2019-05-22 ENCOUNTER — Encounter (HOSPITAL_COMMUNITY): Payer: Self-pay

## 2019-05-23 ENCOUNTER — Other Ambulatory Visit: Payer: Self-pay

## 2019-05-23 ENCOUNTER — Telehealth: Payer: Self-pay | Admitting: *Deleted

## 2019-05-23 ENCOUNTER — Encounter: Payer: Self-pay | Admitting: Family Medicine

## 2019-05-23 ENCOUNTER — Ambulatory Visit: Payer: Self-pay

## 2019-05-23 ENCOUNTER — Ambulatory Visit (INDEPENDENT_AMBULATORY_CARE_PROVIDER_SITE_OTHER): Payer: BC Managed Care – PPO | Admitting: Family Medicine

## 2019-05-23 VITALS — Temp 99.5°F

## 2019-05-23 DIAGNOSIS — R05 Cough: Secondary | ICD-10-CM

## 2019-05-23 DIAGNOSIS — Z20828 Contact with and (suspected) exposure to other viral communicable diseases: Secondary | ICD-10-CM | POA: Diagnosis not present

## 2019-05-23 DIAGNOSIS — R0602 Shortness of breath: Secondary | ICD-10-CM

## 2019-05-23 DIAGNOSIS — J029 Acute pharyngitis, unspecified: Secondary | ICD-10-CM | POA: Diagnosis not present

## 2019-05-23 DIAGNOSIS — Z20822 Contact with and (suspected) exposure to covid-19: Secondary | ICD-10-CM

## 2019-05-23 DIAGNOSIS — R509 Fever, unspecified: Secondary | ICD-10-CM | POA: Diagnosis not present

## 2019-05-23 DIAGNOSIS — R059 Cough, unspecified: Secondary | ICD-10-CM

## 2019-05-23 NOTE — Telephone Encounter (Signed)
Please schedule virtual visit

## 2019-05-23 NOTE — Telephone Encounter (Signed)
-----   Message from Lucretia Kern, DO sent at 05/23/2019  1:21 PM EDT ----- -COVID19 testing - she would like to do with her mother - see prior pt note-follow up with me on Tuesday of next week or with Inez Catalina on Monday per her preference

## 2019-05-23 NOTE — Telephone Encounter (Signed)
Patient scheduled for virtual visit with Dr. Maudie Mercury today

## 2019-05-23 NOTE — Telephone Encounter (Signed)
Community message sent to the [facility] for COVID testing and the pts mother is aware someone will call with appt info.  Follow up visit scheduled for 6/23.

## 2019-05-23 NOTE — Patient Instructions (Addendum)
I have asked my assistant to order Coronavirus (COVID19) testing for you. Please call our office if you have any concerns or questions or this testing has not been arranged in the next 24-48 hours.   Self Isolate: -see the CDC site for information:   https://www.cdc.gov/coronavirus/2019-ncov/if-you-are-sick/steps-when-sick.html   -stay home except for to seek medical care -stay in your own room away from others in your house, wash hands frequently, wear a mask if you leave your room and interacted as little as possible with others -seek medical care if worsening - call out office for a visit or call ahead if going elsewhere -seek emergency care if very sick or severe symptoms - call 911 -isolate for at least 10 days from the onset of symptoms PLUS 3 days of no fever PLUS 3 days of improving symptoms, unless instructed otherwise by a doctor.   Follow up Monday or Tuesday. Sooner as needed.  

## 2019-05-23 NOTE — Progress Notes (Signed)
Virtual Visit via Video Note  I connected with Jamie Humphrey  on 05/23/19 at  1:00 PM EDT by a video enabled telemedicine application and verified that I am speaking with the correct person using two identifiers.  Location patient: home Location provider:work or home office Persons participating in the virtual visit: patient, provider  I discussed the limitations of evaluation and management by telemedicine and the availability of in person appointments. The patient expressed understanding and agreed to proceed.   HPI:  Acute visit for possible COVID19. She hung out with a friend about 14 days ago that then was diagnosed with Jamie Humphrey. She started to get some symptoms about 5 days days ago. Symptoms include sore throat, throat discomfort, fatigue, chest tightness, mild SOB, chills, low grade temp of 100. Highest temp has been around 100.2.  Denies body aches, diarrhea, nausea, vomiting.   ROS: See pertinent positives and negatives per HPI.  Past Medical History:  Diagnosis Date  . Acute respiratory failure with hypoxemia (Jamie Humphrey) 07/10/2014  . Allergy   . Anxiety   . Asthma    exercise induced  . Depression     Past Surgical History:  Procedure Laterality Date  . DENTAL SURGERY     pulpectomy    Family History  Problem Relation Age of Onset  . Suicidality Father     SOCIAL HX: see hpi   Current Outpatient Medications:  .  lisdexamfetamine (VYVANSE) 30 MG capsule, Take 1 capsule (30 mg total) by mouth daily., Disp: 30 capsule, Rfl: 0 .  nicotine polacrilex (NICORETTE) 2 MG lozenge, Take 2 mg by mouth as needed for smoking cessation., Disp: , Rfl:  .  SRONYX 0.1-20 MG-MCG tablet, TAKE 1 TABLET BY MOUTH EVERY DAY, Disp: 28 tablet, Rfl: 11  EXAM:  VITALS per patient if applicable: Temp 427 curretly  GENERAL: alert, oriented, appears well and in no acute distress  HEENT: atraumatic, conjunttiva clear, no obvious abnormalities on inspection of external nose and ears, mild post  oropharyngeal erytheam  NECK: normal movements of the head and neck  LUNGS: on inspection no signs of respiratory distress, breathing rate appears normal, no obvious gross SOB, gasping or wheezing  CV: no obvious cyanosis  MS: moves all visible extremities without noticeable abnormality  PSYCH/NEURO: pleasant and cooperative, no obvious depression or anxiety, speech and thought processing grossly intact  ASSESSMENT AND PLAN:  More than 50% of over 25 minutes spent in total in caring for this patient was spent  counseling and/or coordinating care.   Discussed the following assessment and plan:  Exposure to 2019 Novel Coronavirus   Cough  Elevated temperature   Sore throat   SOB (shortness of breath)  -we discussed possible serious and likely etiologies, workup and treatment, treatment risks and return precautions -after this discussion, Jamie Humphrey opted for COVID19 testing (asked assistant to order), symptomatic care, return and ER precautions, home isolation per CDC protocol, close follow up Monday or tuesday -of course, we advised Jamie Humphrey  to return or notify a doctor immediately if symptoms worsen or persist or new concerns arise.   I discussed the assessment and treatment plan with the patient. The patient was provided an opportunity to ask questions and all were answered. The patient agreed with the plan and demonstrated an understanding of the instructions.    Follow up instructions: Advised assistant Jamie Humphrey to help patient arrange the following: -COVID testing -follow up Monday or [place], DO

## 2019-05-23 NOTE — Telephone Encounter (Signed)
Patient referred for COVID-19 testing by Dr. Colin Benton.   Patient called and scheduled for testing at [facility] site on 05/23/19.

## 2019-05-23 NOTE — Telephone Encounter (Signed)
Pt called stating that her friend has just been tested positive for Covid-19.  She states that 6/9 they spent the day together in a car and eating together.  She now has mild chest tightness rt side only. Low grade fever 100.1. She feels very fatigued. She has a cough. Care advice read to patient. Patient verbalized understanding. Per Claris CheMargaret at the office note will be routed to office. Pt is aware. Reason for Disposition . MILD difficulty breathing (e.g., minimal/no SOB at rest, SOB with walking, pulse <100)  Answer Assessment - Initial Assessment Questions 1. CLOSE CONTACT: "Who is the person with the confirmed or suspected COVID-19 infection that you were exposed to?"     friend 2. PLACE of CONTACT: "Where were you when you were exposed to COVID-19?" (e.g., home, school, medical waiting room; which city?)    In car together eating 3. TYPE of CONTACT: "How much contact was there?" (e.g., sitting next to, live in same house, work in same office, same building)    Same car 4. DURATION of CONTACT: "How long were you in contact with the COVID-19 patient?" (e.g., a few seconds, passed by person, a few minutes, live with the patient)    6 hours 5. DATE of CONTACT: "When did you have contact with a COVID-19 patient?" (e.g., how many days ago)     June 9 6. TRAVEL: "Have you traveled out of the country recently?" If so, "When and where?"     * Also ask about out-of-state travel, since the CDC has identified some high-risk cities for community spread in the [place].     * Note: Travel becomes less relevant if there is widespread community transmission where the patient lives.    no 7. COMMUNITY SPREAD: "Are there lots of cases of COVID-19 (community spread) where you live?" (See public health department website, if unsure)      [place] 8. SYMPTOMS: "Do you have any symptoms?" (e.g., fever, cough, breathing difficulty)    Fever, lack of energy, tight of rt chest, achy 9. PREGNANCY OR POSTPARTUM:  "Is there any chance you are pregnant?" "When was your last menstrual period?" "Did you deliver in the last 2 weeks?"    abstant 3 months 10. HIGH RISK: "Do you have any heart or lung problems? Do you have a weak immune system?" (e.g., CHF, COPD, asthma, HIV positive, chemotherapy, renal failure, diabetes mellitus, sickle cell anemia)      no  Answer Assessment - Initial Assessment Questions 1. COVID-19 DIAGNOSIS: "Who made your Coronavirus (COVID-19) diagnosis?" "Was it confirmed by a positive lab test?" If not diagnosed by a HCP, ask "Are there lots of cases (community spread) where you live?" (See public health department website, if unsure)     [place] 2. ONSET: "When did the COVID-19 symptoms start?"      4 days ago 3. WORST SYMPTOM: "What is your worst symptom?" (e.g., cough, fever, shortness of breath, muscle aches)     Heaviness in chest 4. COUGH: "Do you have a cough?" If so, ask: "How bad is the cough?"       A little cough 5. FEVER: "Do you have a fever?" If so, ask: "What is your temperature, how was it measured, and when did it start?"     100.1 6. RESPIRATORY STATUS: "Describe your breathing?" (e.g., shortness of breath, wheezing, unable to speak)     SOB 7. BETTER-SAME-WORSE: "Are you getting better, staying the same or getting worse compared to yesterday?"  If  getting worse, ask, "In what way?"     better 8. HIGH RISK DISEASE: "Do you have any chronic medical problems?" (e.g., asthma, heart or lung disease, weak immune system, etc.)     Running induced asthma 9. PREGNANCY: "Is there any chance you are pregnant?" "When was your last menstrual period?"    No 10. OTHER SYMPTOMS: "Do you have any other symptoms?"  (e.g., chills, fatigue, headache, loss of smell or taste, muscle pain, sore throat)      Fatigued chills, headaches,  Protocols used: CORONAVIRUS (COVID-19) DIAGNOSED OR SUSPECTED-A-AH, CORONAVIRUS (COVID-19) EXPOSURE-A-AH

## 2019-05-25 LAB — NOVEL CORONAVIRUS, NAA: SARS-CoV-2, NAA: NOT DETECTED

## 2019-05-28 ENCOUNTER — Ambulatory Visit (INDEPENDENT_AMBULATORY_CARE_PROVIDER_SITE_OTHER): Payer: BC Managed Care – PPO | Admitting: Family Medicine

## 2019-05-28 ENCOUNTER — Other Ambulatory Visit: Payer: Self-pay

## 2019-05-28 ENCOUNTER — Encounter: Payer: Self-pay | Admitting: Family Medicine

## 2019-05-28 DIAGNOSIS — J989 Respiratory disorder, unspecified: Secondary | ICD-10-CM

## 2019-05-28 DIAGNOSIS — Z20828 Contact with and (suspected) exposure to other viral communicable diseases: Secondary | ICD-10-CM | POA: Diagnosis not present

## 2019-05-28 DIAGNOSIS — Z20822 Contact with and (suspected) exposure to covid-19: Secondary | ICD-10-CM

## 2019-05-28 NOTE — Progress Notes (Signed)
Virtual Visit via Video Note  I connected with Jamie Humphrey  on 05/28/19 at 11:40 AM EDT by a video enabled telemedicine application and verified that I am speaking with the correct person using two identifiers.  Location patient: home Location provider:work or home office Persons participating in the virtual visit: patient, provider  I discussed the limitations of evaluation and management by telemedicine and the availability of in person appointments. The patient expressed understanding and agreed to proceed.   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  Past Medical History:  Diagnosis Date  . Acute respiratory failure with hypoxemia ([place]) 07/10/2014  . Allergy   . Anxiety   . Asthma    exercise induced  . Depression     Past Surgical History:  Procedure Laterality Date  . DENTAL SURGERY     pulpectomy    Family History  Problem Relation Age of Onset  . Suicidality Father     SOCIAL HX: see hpi   Current Outpatient Medications:  .  lisdexamfetamine (VYVANSE) 30 MG capsule, Take 1 capsule (30 mg total) by mouth daily., Disp: 30 capsule, Rfl: 0 .  nicotine polacrilex (NICORETTE) 2 MG lozenge, Take 2 mg by mouth as needed for smoking cessation., Disp: , Rfl:  .  SRONYX 0.1-20 MG-MCG tablet, TAKE 1 TABLET BY MOUTH EVERY DAY, Disp: 28 tablet, Rfl: 11  EXAM:  VITALS per patient if applicable:  GENERAL: alert, oriented, appears well and in no acute distress  HEENT: atraumatic, conjunttiva clear, no obvious abnormalities on inspection of external nose and ears  NECK: normal  movements of the head and neck  LUNGS: on inspection no signs of respiratory distress, breathing rate appears normal, no obvious gross SOB, gasping or wheezing  CV: no obvious cyanosis  MS: moves all visible extremities without noticeable abnormality  PSYCH/NEURO: pleasant and cooperative, no obvious depression or anxiety, speech and thought processing grossly intact  ASSESSMENT AND PLAN:  Discussed the following assessment and plan:  Close Exposure to Covid-19 Virus -  Respiratory illness -  -continue quarantine per CDC guidelines -will ask office to set up in office visit she requested once out of quarantine and no longer having symptoms -discussed pros/cons/limitations of antigen and antibody testing -she is considering antibody testing as was very sick in march, advised to wait 14 days from this illness to do so    I discussed the assessment and treatment plan with the patient. The patient was provided an opportunity to ask questions and all were answered. The patient agreed with the plan and demonstrated an understanding of the instructions.   The patient was advised to call back or seek an in-person evaluation if the symptoms worsen or if the condition fails to improve as anticipated.   Follow up instructions: Advised assistant Jamie Humphrey to help patient arrange the following: -in person CPE or exam in 2-4 weeks once over quarantine with PCP - requesting exam for grad school  Jamie Kern, DO

## 2019-05-29 ENCOUNTER — Telehealth: Payer: Self-pay | Admitting: *Deleted

## 2019-05-29 NOTE — Telephone Encounter (Signed)
-----   Message from Lucretia Kern, DO sent at 05/28/2019 11:54 AM EDT ----- -in person CPE or exam in 2-4 weeks once over quarantine with PCP - requesting exam for grad school

## 2019-05-29 NOTE — Telephone Encounter (Signed)
I called the pt to schedule an appt as below and her mother stated the pt wants to change from Dr Martinique to Dr Ethlyn Gallery.  Message sent to Dr Martinique.

## 2019-05-30 NOTE — Telephone Encounter (Signed)
She is Dr Ledell Noss pt. BJ

## 2019-05-31 NOTE — Telephone Encounter (Signed)
Ok with transfer request

## 2019-05-31 NOTE — Telephone Encounter (Signed)
I left a detailed message at the pts cell number to call back for a transfer of care visit with Dr Ethlyn Gallery.

## 2019-06-04 ENCOUNTER — Other Ambulatory Visit: Payer: Self-pay | Admitting: Family Medicine

## 2019-06-04 DIAGNOSIS — J069 Acute upper respiratory infection, unspecified: Secondary | ICD-10-CM

## 2019-06-05 ENCOUNTER — Telehealth (HOSPITAL_COMMUNITY): Payer: Self-pay

## 2019-06-05 ENCOUNTER — Other Ambulatory Visit (HOSPITAL_COMMUNITY): Payer: Self-pay | Admitting: Psychiatry

## 2019-06-05 DIAGNOSIS — F902 Attention-deficit hyperactivity disorder, combined type: Secondary | ICD-10-CM

## 2019-06-05 MED ORDER — LISDEXAMFETAMINE DIMESYLATE 30 MG PO CAPS: 30.0000 mg | ORAL_CAPSULE | Freq: Every day | ORAL | 0 refills | Status: DC

## 2019-06-05 NOTE — Telephone Encounter (Signed)
sent 

## 2019-06-05 NOTE — Telephone Encounter (Signed)
This is an Arfeen patient:   Patient is calling for a refill on her Vyvanse, last filled on 5/28. Patient has a follow up for 7/10 [facility] [address]. Thank you.

## 2019-06-14 ENCOUNTER — Encounter (HOSPITAL_COMMUNITY): Payer: Self-pay | Admitting: Psychiatry

## 2019-06-14 ENCOUNTER — Other Ambulatory Visit: Payer: Self-pay

## 2019-06-14 ENCOUNTER — Encounter: Payer: Self-pay | Admitting: Internal Medicine

## 2019-06-14 ENCOUNTER — Ambulatory Visit (INDEPENDENT_AMBULATORY_CARE_PROVIDER_SITE_OTHER): Payer: BC Managed Care – PPO | Admitting: Psychiatry

## 2019-06-14 DIAGNOSIS — R5383 Other fatigue: Secondary | ICD-10-CM

## 2019-06-14 DIAGNOSIS — F902 Attention-deficit hyperactivity disorder, combined type: Secondary | ICD-10-CM | POA: Diagnosis not present

## 2019-06-14 MED ORDER — LISDEXAMFETAMINE DIMESYLATE 30 MG PO CAPS: 30.0000 mg | ORAL_CAPSULE | Freq: Every day | ORAL | 0 refills | Status: DC

## 2019-06-14 NOTE — Progress Notes (Signed)
Virtual Visit via Telephone Note  I connected with Jamie Humphrey on 06/14/19 at  8:40 AM EDT by telephone and verified that I am speaking with the correct person using two identifiers.   I discussed the limitations, risks, security and privacy concerns of performing an evaluation and management service by telephone and the availability of in person appointments. I also discussed with the patient that there may be a patient responsible charge related to this service. The patient expressed understanding and agreed to proceed.   History of Present Illness: Patient was evaluated through phone session.  She endorsed past few months very stressful because she thought that she had COVID-19 however her test was negative.  Patient feels that her symptoms were consistent with COVID-19 because she was having cough, extreme fatigue, flulike symptoms and feeling tired.  She is hoping to get antibody test because she still believes she was exposed to COVID-19.  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  She is taking Vyvanse.  She endorsed that is helping her attention, focus, multitasking.  She started gardening and she was disappointed when saw her vegetables were eaten by rabbits.  She does not want to give up and she is still wants to spend time with the backyard.  Lately she admitted watching news and upset with the current situation related to racism and in discrimination.  However she is not actively participating in the process.  She is sleeping good.  Her appetite is okay.  She is maintaining her weight which is 98.  She denies any paranoia, hallucination, mood swing.  She takes Vyvanse Monday to Friday.  She lives with her mother who is very supportive.  Patient denies drinking or using any illegal substances.  She has no tremors, shakes or any EPS.      Past Psychiatric History:Reviewed. H/O ADHD TriedStrattera without any  improvement. Tried Lexapro for few months after break-up with the boyfriend. No history of inpatient or any suicidal attempt.  Recent Results (from the past 2160 hour(s))  Novel Coronavirus, NAA ([facility])     Status: None   Collection Time: 05/23/19  1:56 PM  Result Value Ref Range   SARS-CoV-2, NAA Not Detected Not Detected    Comment: Testing was performed using the cobas(R) SARS-CoV-2 test. This test was developed and its performance characteristics determined by [employer]. This test has not been FDA cleared or approved. This test has been authorized by FDA under an Emergency Use Authorization (EUA). This test is only authorized for the duration of time the declaration that circumstances exist justifying the authorization of the emergency use of in vitro diagnostic tests for detection of SARS-CoV-2 virus and/or diagnosis of COVID-19 infection under section 564(b)(1) of the Act, 21 U.S.C. 938BOF-7(P)(1), unless the authorization is terminated or revoked sooner. When diagnostic testing is negative, the possibility of a false negative result should be considered in the context of a patient's recent exposures and the presence of clinical signs and symptoms consistent with COVID-19. An individual without symptoms of COVID-19 and who is not shedding SARS-CoV-2 virus would expect to have a negati ve (not detected) result in this assay.     Psychiatric Specialty Exam: Physical Exam  ROS  There were no vitals taken for this visit.There is no height or weight on file to calculate BMI.  General Appearance: NA  Eye Contact:  NA  Speech:  Clear and Coherent and Normal Rate  Volume:  Normal  Mood:  Anxious  Affect:  NA  Thought Process:  Coherent and Goal Directed  Orientation:  Full (Time, Place, and Person)  Thought Content:  WDL and Logical  Suicidal Thoughts:  No  Homicidal Thoughts:  No  Memory:  Immediate;   Good Recent;   Good Remote;   Good  Judgement:  Good   Insight:  Good  Psychomotor Activity:  NA  Concentration:  Concentration: Fair and Attention Span: Fair  Recall:  Good  Fund of Knowledge:  Good  Language:  Good  Akathisia:  No  Handed:  Right  AIMS (if indicated):     Assets:  Communication Skills Desire for Improvement Housing Resilience Social Support Talents/Skills  ADL's:  Intact  Cognition:  WNL  Sleep:   good      Assessment and Plan: Attention deficit disorder, inattentive type.  I reviewed her blood work results.  Her COVID test was negative.  She is hoping to get antibody to have a peace of mind.  She feels the Vyvanse working very well.  She has no tremors shakes or any EPS.  She is hoping to get her visa arrived so she can start overseas study in [place].  Discussed medication side effects and benefits specially insomnia, weight loss, tremors.  Patient denies the symptoms.  She like to schedule another appointment before she departments pain in September.  I recommend to call us back if is any question or any concern.  Follow-up in 6 weeks.  Follow Up Instructions:    I discussed the assessment and treatment plan with the patient. The patient was provided an opportunity to ask questions and all were answered. The patient agreed with the plan and demonstrated an understanding of the instructions.   The patient was advised to call back or seek an in-person evaluation if the symptoms worsen or if the condition fails to improve as anticipated.  I provided 20 minutes of non-face-to-face time during this encounter.   Cleotis NipperSyed T Casimer Russett, MD

## 2019-06-24 ENCOUNTER — Telehealth: Payer: Self-pay | Admitting: Internal Medicine

## 2019-06-24 NOTE — Telephone Encounter (Signed)
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 She is saying that her school is needing her to get this test done. She did test negative for COVID on 05/23/2019. Please advise.

## 2019-06-25 ENCOUNTER — Other Ambulatory Visit: Payer: Self-pay | Admitting: Internal Medicine

## 2019-06-25 DIAGNOSIS — Z20828 Contact with and (suspected) exposure to other viral communicable diseases: Secondary | ICD-10-CM

## 2019-06-25 DIAGNOSIS — Z20822 Contact with and (suspected) exposure to covid-19: Secondary | ICD-10-CM

## 2019-06-25 NOTE — Telephone Encounter (Signed)
Left message on machine for patient to return our call Patient will also need to schedule a lab appointment.

## 2019-06-25 NOTE — Telephone Encounter (Signed)
Lab appointment scheduled 

## 2019-06-25 NOTE — Telephone Encounter (Signed)
If she wants this done I can order. Will advise tho, that we are not quite sure how to interpret this test yet: A positive antibody test does not necessarily confer immunity.

## 2019-07-01 ENCOUNTER — Encounter (HOSPITAL_COMMUNITY): Payer: Self-pay

## 2019-07-03 ENCOUNTER — Other Ambulatory Visit: Payer: Self-pay

## 2019-07-03 ENCOUNTER — Other Ambulatory Visit (INDEPENDENT_AMBULATORY_CARE_PROVIDER_SITE_OTHER): Payer: BC Managed Care – PPO

## 2019-07-03 DIAGNOSIS — Z20822 Contact with and (suspected) exposure to covid-19: Secondary | ICD-10-CM

## 2019-07-03 DIAGNOSIS — Z20828 Contact with and (suspected) exposure to other viral communicable diseases: Secondary | ICD-10-CM

## 2019-07-03 DIAGNOSIS — R5383 Other fatigue: Secondary | ICD-10-CM

## 2019-07-03 LAB — COMPREHENSIVE METABOLIC PANEL
ALT: 7 U/L (ref 0–35)
AST: 10 U/L (ref 0–37)
Albumin: 4.7 g/dL (ref 3.5–5.2)
Alkaline Phosphatase: 44 U/L (ref 39–117)
BUN: 13 mg/dL (ref 6–23)
CO2: 23 mEq/L (ref 19–32)
Calcium: 9.2 mg/dL (ref 8.4–10.5)
Chloride: 103 mEq/L (ref 96–112)
Creatinine, Ser: 0.72 mg/dL (ref 0.40–1.20)
GFR: 99.94 mL/min (ref >60.00)
Glucose, Bld: 101 mg/dL — ABNORMAL HIGH (ref 70–99)
Potassium: 4 mEq/L (ref 3.5–5.1)
Sodium: 136 mEq/L (ref 135–145)
Total Bilirubin: 0.5 mg/dL (ref 0.2–1.2)
Total Protein: 7.4 g/dL (ref 6.0–8.3)

## 2019-07-03 LAB — CBC WITH DIFFERENTIAL/PLATELET
Basophils Absolute: 0 10*3/uL (ref 0.0–0.1)
Basophils Relative: 0.7 % (ref 0.0–3.0)
Eosinophils Absolute: 0.3 10*3/uL (ref 0.0–0.7)
Eosinophils Relative: 4.7 % (ref 0.0–5.0)
HCT: 40.2 % (ref 36.0–46.0)
Hemoglobin: 13.5 g/dL (ref 12.0–15.0)
Lymphocytes Relative: 49.8 % — ABNORMAL HIGH (ref 12.0–46.0)
Lymphs Abs: 3.1 10*3/uL (ref 0.7–4.0)
MCHC: 33.4 g/dL (ref 30.0–36.0)
MCV: 92.1 fl (ref 78.0–100.0)
Monocytes Absolute: 0.2 10*3/uL (ref 0.1–1.0)
Monocytes Relative: 3.3 % (ref 3.0–12.0)
Neutro Abs: 2.6 10*3/uL (ref 1.4–7.7)
Neutrophils Relative %: 41.5 % — ABNORMAL LOW (ref 43.0–77.0)
Platelets: 315 10*3/uL (ref 150.0–400.0)
RBC: 4.37 Mil/uL (ref 3.87–5.11)
RDW: 12.5 % (ref 11.5–15.5)
WBC: 6.3 10*3/uL (ref 4.0–10.5)

## 2019-07-03 LAB — TSH: TSH: 1.37 u[IU]/mL (ref 0.35–4.50)

## 2019-07-03 LAB — VITAMIN D 25 HYDROXY (VIT D DEFICIENCY, FRACTURES): VITD: 30.03 ng/mL (ref 30.00–100.00)

## 2019-07-04 LAB — SAR COV2 SEROLOGY (COVID19)AB(IGG),IA: SARS CoV2 AB IGG: NEGATIVE

## 2019-07-26 ENCOUNTER — Other Ambulatory Visit: Payer: Self-pay

## 2019-07-26 ENCOUNTER — Encounter (HOSPITAL_COMMUNITY): Payer: Self-pay | Admitting: Psychiatry

## 2019-07-26 ENCOUNTER — Ambulatory Visit (INDEPENDENT_AMBULATORY_CARE_PROVIDER_SITE_OTHER): Payer: BC Managed Care – PPO | Admitting: Psychiatry

## 2019-07-26 DIAGNOSIS — F902 Attention-deficit hyperactivity disorder, combined type: Secondary | ICD-10-CM | POA: Diagnosis not present

## 2019-07-26 MED ORDER — LISDEXAMFETAMINE DIMESYLATE 30 MG PO CAPS: 30.0000 mg | ORAL_CAPSULE | Freq: Every day | ORAL | 0 refills | Status: DC

## 2019-07-26 NOTE — Progress Notes (Signed)
Virtual Visit via Telephone Note  I connected with Jamie Humphrey on 07/26/19 at  8:40 AM EDT by telephone and verified that I am speaking with the correct person using two identifiers.   I discussed the limitations, risks, security and privacy concerns of performing an evaluation and management service by telephone and the availability of in person appointments. I also discussed with the patient that there may be a patient responsible charge related to this service. The patient expressed understanding and agreed to proceed.   History of Present Illness: Patient was evaluated by phone session.  She has been frustrated because she has not received her visa yet and she cannot start her studies in [place].  She is not happy with the current electrical situation.  She is also frustrated because her COVID-19 antibody test came back negative and she thought that she had the COVID-19.  Slowly and gradually her energy level is getting better.  She is more focusing on her diet, exercise.  She is sleeping good.  She had a good support from her mother who lives with her.  She had submitted all the paper which is required to issue visa.  She is hoping any days she can get her visa to study abroad.  She denies any paranoia, hallucinations, irritability or any anger.  She denies any suicidal thoughts or homicidal thought.  She denies drinking or using any illegal substances.  She reported her energy level is improving.  She has no tremors, shakes or any EPS.  She is taking Vyvanse Monday to Friday.  Her attention, focus and multitasking is good.  She endorsed her thoughts are organized clear and she is able to do her tasks.  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  She also wants to start therapy in the future.  She is not sure how she will continue her medication and this pain but she has plan to visit [place] in Thanksgiving and Christmas.  She like  to continue for care in this office.  Recently she had blood work and it was essentially normal.  No new medicines added.   Past Psychiatric History:Reviewed. H/O ADHD. TriedStrattera without any improvement. Tried Lexapro for few months after break-up with the boyfriend. No history of inpatient or any suicidal attempt.    Psychiatric Specialty Exam: Physical Exam  ROS  There were no vitals taken for this visit.There is no height or weight on file to calculate BMI.  General Appearance: NA  Eye Contact:  NA  Speech:  Clear and Coherent  Volume:  Normal  Mood:  Anxious and Frustrated  Affect:  NA  Thought Process:  Goal Directed  Orientation:  Full (Time, Place, and Person)  Thought Content:  Logical  Suicidal Thoughts:  No  Homicidal Thoughts:  No  Memory:  Immediate;   Good Recent;   Good Remote;   Good  Judgement:  Good  Insight:  Good  Psychomotor Activity:  Normal  Concentration:  Concentration: Good and Attention Span: Good  Recall:  Good  Fund of Knowledge:  Good  Language:  Good  Akathisia:  No  Handed:  Right  AIMS (if indicated):     Assets:  Communication Skills Desire for [place] Talents/Skills  ADL's:  Intact  Cognition:  WNL  Sleep:   good      Assessment and Plan: Attention deficit disorder, inattentive type.  I reviewed her blood  work results.  Reassurance given.  Patient does not want to change her medication since it is helping her focus, attention, multitasking.  She is meeting her visa which can arrives any day.  She like to keep the appointment in 3 months as she has a plan to come back [place] to visit her mother in Thanksgiving.  She has no tremors, shakes or any EPS.  Encourage healthy lifestyle and do regular exercise.  Follow-up in 3 months.    Follow Up Instructions:    I discussed the assessment and treatment plan with the patient. The patient was provided an opportunity to ask questions and all  were answered. The patient agreed with the plan and demonstrated an understanding of the instructions.   The patient was advised to call back or seek an in-person evaluation if the symptoms worsen or if the condition fails to improve as anticipated.  I provided 15 minutes of non-face-to-face time during this encounter.   Cleotis NipperSyed T Keryl Gholson, MD

## 2019-08-09 ENCOUNTER — Encounter (HOSPITAL_COMMUNITY): Payer: Self-pay

## 2019-10-17 ENCOUNTER — Other Ambulatory Visit: Payer: Self-pay | Admitting: Internal Medicine

## 2019-10-17 DIAGNOSIS — Z3002 Counseling and instruction in natural family planning to avoid pregnancy: Secondary | ICD-10-CM

## 2019-10-28 ENCOUNTER — Other Ambulatory Visit: Payer: Self-pay

## 2019-10-28 ENCOUNTER — Encounter (HOSPITAL_COMMUNITY): Payer: Self-pay | Admitting: Psychiatry

## 2019-10-28 ENCOUNTER — Ambulatory Visit (INDEPENDENT_AMBULATORY_CARE_PROVIDER_SITE_OTHER): Payer: BC Managed Care – PPO | Admitting: Psychiatry

## 2019-10-28 DIAGNOSIS — F902 Attention-deficit hyperactivity disorder, combined type: Secondary | ICD-10-CM

## 2019-10-28 MED ORDER — LISDEXAMFETAMINE DIMESYLATE 30 MG PO CAPS: 30.0000 mg | ORAL_CAPSULE | Freq: Every day | ORAL | 0 refills | Status: AC

## 2019-10-28 NOTE — Progress Notes (Signed)
Virtual Visit via Telephone Note  I connected with Jamie Humphrey on 10/28/19 at  8:40 AM EST by telephone and verified that I am speaking with the correct person using two identifiers.   I discussed the limitations, risks, security and privacy concerns of performing an evaluation and management service by telephone and the availability of in person appointments. I also discussed with the patient that there may be a patient responsible charge related to this service. The patient expressed understanding and agreed to proceed.   History of Present Illness: Patient was evaluated by phone session.  She is now living in [place].  Her mother was also on the phone to coordinate this visit.  Patient is doing very well on her current medication.  She does not take Vyvanse every day and she feels her attention, focus and multitasking is good.  She is making good grades.  She is Chief Financial Officer, global media and environmental justice.  She feels the professors are very helpful.  Even though she is taking virtual classes but her professors are available when she needs some help.  She is hoping to coming [place] in December to spend time with the mother.  Patient has no tremors, shakes or any EPS.  She is happy because she gained weight since the last visit because she enjoys the food locally.  She denies drinking or using any illegal substances.  She likes to continue her current medication.  Her energy level is good.  There were no new medication added since the last visit.   Past Psychiatric History:Reviewed. H/O ADHD. TriedStrattera but no improvement. Tried Lexapro for few months after break-up with the boyfriend. No history of inpatient or suicidal attempt.    Psychiatric Specialty Exam: Physical Exam  ROS  There were no vitals taken for this visit.There is no height or weight on file to calculate BMI.  General Appearance: NA  Eye Contact:  NA  Speech:  Clear and Coherent and Normal  Rate  Volume:  Normal  Mood:  Euthymic  Affect:  NA  Thought Process:  Goal Directed  Orientation:  NA  Thought Content:  WDL and Logical  Suicidal Thoughts:  No  Homicidal Thoughts:  No  Memory:  Immediate;   Good Recent;   Good Remote;   Good  Judgement:  Good  Insight:  Good  Psychomotor Activity:  NA  Concentration:  Concentration: Good and Attention Span: Good  Recall:  Good  Fund of Knowledge:  Good  Language:  Good  Akathisia:  No  Handed:  Right  AIMS (if indicated):     Assets:  Communication Skills Desire for Improvement Resilience Social Support Talents/Skills  ADL's:  Intact  Cognition:  WNL  Sleep:   ok      Assessment and Plan: Attention deficit disorder, inattentive type.  Patient doing well on her current medication.  She takes Vyvanse 30 mg Monday to Friday and that seems to be working.  She is hoping to spend time with the mother in Christmas.  Discussed medication side effects and benefits.  Continue Vyvanse 30 mg.  Discussed stimulant abuse, tolerance, withdrawal and dependency.  Recommended to call us back if she has any question of any concern.  Follow-up in 3 months.  Follow Up Instructions:    I discussed the assessment and treatment plan with the patient. The patient was provided an opportunity to ask questions and all were answered. The patient agreed with the plan and demonstrated an understanding of the  instructions.   The patient was advised to call back or seek an in-person evaluation if the symptoms worsen or if the condition fails to improve as anticipated.  I provided 15 minutes of non-face-to-face time during this encounter.   Kathlee Nations, MD

## 2020-01-27 ENCOUNTER — Ambulatory Visit (HOSPITAL_COMMUNITY): Payer: BC Managed Care – PPO | Admitting: Psychiatry

## 2020-04-22 ENCOUNTER — Telehealth: Payer: Self-pay | Admitting: Internal Medicine

## 2020-04-22 NOTE — Telephone Encounter (Signed)
The best we can do is refill current Rx for her. If they would like this, ok to order.

## 2020-04-22 NOTE — Telephone Encounter (Signed)
Pts mother is calling in stating that the pt has not received her SRONYX in [place] and would like to see if Dr. Ardyth Harps could go on the international medical website to see what the equivanlance to the [facility] in the [place] for [place], [place].  Pts mother would like to pick up a copy of the prescription to take to the pharmacy and send the medication via mail to the pt.  Mother would like to be called when ready hoping to be able to get it today.

## 2020-04-22 NOTE — Telephone Encounter (Signed)
Spoke with Mom.  She has already sent in a refill through the mail. No refill needed at this time.

## 2020-09-11 ENCOUNTER — Other Ambulatory Visit: Payer: Self-pay | Admitting: Internal Medicine

## 2020-09-11 DIAGNOSIS — Z3002 Counseling and instruction in natural family planning to avoid pregnancy: Secondary | ICD-10-CM

## 2020-09-11 MED ORDER — LEVONORGESTREL-ETHINYL ESTRAD 0.1-20 MG-MCG PO TABS: 1.0000 | ORAL_TABLET | Freq: Every day | ORAL | 0 refills | Status: AC

## 2020-09-11 NOTE — Telephone Encounter (Signed)
Rx done. 

## 2020-09-11 NOTE — Telephone Encounter (Signed)
Pts mother is calling in stating that the pt is out of Rx SRONYX 0.1-20 MG-MCG  [facility]arm:  [facility] [address]   Mother would like to see if she can get it today so that she can get it sent off to the pt in [place].

## 2021-09-13 ENCOUNTER — Encounter (HOSPITAL_COMMUNITY): Payer: Self-pay | Admitting: Psychiatry
